# Patient Record
Sex: Male | Born: 1990 | Race: Black or African American | Hispanic: No | Marital: Single | Smoking: Never smoker
Health system: Southern US, Community
[De-identification: ages and names within clinical notes are randomized; demographics above are authoritative.]

---

## 2020-09-12 ENCOUNTER — Telehealth: Payer: Self-pay

## 2020-09-12 NOTE — Telephone Encounter (Signed)
[facility] Patient Product/process development scientist completed.    The patient is insured through [facility] ([place] and has a $120.00 copay. Patient will need a Co-Pay Coupon Card  We will continue to follow to see if copay assistance is needed.  Clearance Coots, CPhT Specialty Pharmacy Patient [facility] Infect[facility] Disease Phone: [phone] Fax:  [phone]

## 2020-09-17 ENCOUNTER — Ambulatory Visit: Payer: BC Managed Care – PPO | Admitting: Infectious Diseases

## 2020-09-17 ENCOUNTER — Other Ambulatory Visit: Payer: Self-pay

## 2020-09-17 ENCOUNTER — Other Ambulatory Visit (HOSPITAL_COMMUNITY)
Admission: RE | Admit: 2020-09-17 | Discharge: 2020-09-17 | Disposition: A | Discharge status: Home / Self Care | Payer: BC Managed Care – PPO | Source: Ambulatory Visit | Attending: Infectious Diseases | Admitting: Infectious Diseases

## 2020-09-17 ENCOUNTER — Encounter: Payer: Self-pay | Admitting: Infectious Diseases

## 2020-09-17 VITALS — BP 136/92 | HR 94 | Temp 98.0°F | Ht 72.0 in | Wt 188.1 lb

## 2020-09-17 DIAGNOSIS — B181 Chronic viral hepatitis B without delta-agent: Secondary | ICD-10-CM | POA: Diagnosis not present

## 2020-09-17 DIAGNOSIS — Z113 Encounter for screening for infections with a predominantly sexual mode of transmission: Secondary | ICD-10-CM | POA: Insufficient documentation

## 2020-09-17 DIAGNOSIS — Z23 Encounter for immunization: Secondary | ICD-10-CM

## 2020-09-17 NOTE — Assessment & Plan Note (Signed)
Urine GC and RPR today 

## 2020-09-17 NOTE — Assessment & Plan Note (Signed)
Has received 2 doses of Astrazeneca vaccine and Flu shot Discussed recommended vaccines with Hep B, will check for serology today

## 2020-09-17 NOTE — Assessment & Plan Note (Signed)
Will do additional labs today for need of Hepatitis B treatment

## 2020-09-17 NOTE — Progress Notes (Signed)
[facility]                                      [address], [place], [place], [address]                                               Phn. [phone]; Fax: [phone]                                                               Date: 09/17/2020 Reason for Visit: Hepatitis B    HPI: Paul Hess is a 29 y.o.old male with no PMH who is concerned for possible Hepatitis B infection. He is originally from [place] and moved to [place] in April 21, 2020 for pursuing higher studies. 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 He says before he came to [place], he tested for Hepatitis B and was told to have Hep B infection and he was given some Multivitamins called " liver care " ( pic in media). He denies taking any other medications for Hepatitis B except the MVs and is here for further evaluation and management. In [place], he was told that his VL is in 100s, Liver function test was WNL.  In terms of risk factors, denies personal or family h/o Liver diseases, sex with partners known to have Hep B. Denies blood transfusion or tattoos. Denies using IVDU. Denies using OTC meds or herbal medicines. Denies sharing of toothbrush or razors  He has received astrazeneca 2 doses in [place] and  flu vaccine in health center  ROS: Denies yellowish discoloration of sclera and skin, abdominal pain/distension, hematemesis.            Denies cough, fever, chills, nightsweats, nausea, vomiting, diarrhea, constipation, weight loss, recent hospitalizations, rashes, joint complaints, shortness of breath, chest pain, headaches, dysuria .  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 No  pets at home    Physical exam: Ht 6' (1.829 m)   Wt 188 lb 1.6 oz (85.3 kg)   BMI 25.51 kg/m   Gen: Alert and oriented x 3, no acute distress HEENT: [place]/AT, PERL,no scleral icterus, no pale conjunctivae, hearing normal, oral mucosa moist Neck: Supple, no lymphadenopathy Cardio: Regular rate and rhythm; +S1 and S2; no murmurs, gallops, or rubs Resp: CTAB; no wheezes, rhonchi, or rales GI: Soft, nontender, nondistended, bowel sounds present GU: Musc: Extremities: No cyanosis, clubbing, or edema; +2 PT and DP pulses Skin: No rashes, lesions, or ecchymoses Neuro: No focal deficits Psych: Calm, cooperative   Laboratory  09/05/20 Cr 1.17, total protein 7.7, albumin 4.9, TB 0.5, DB 0.1, Indirect Bili 0.4, AST 15, ALT 12 Wbc 7.3, HB 14, platelets 232 Hep B surface antigen reactive    Assessment/Plan: 1.  Chronic Hepatitis B Discussed the pathogenesis, transmission, prevention, risks of left untreated, and treatment options for hepatitis C Following labs and Imagings today  Orders Placed This Encounter  Procedures  . US ABDOMEN COMPLETE W/ELASTOGRAPHY  . Hepatitis B core antibody, IgM  . Hepatitis B core antibody, total  . Hepatitis B DNA, ultraquantitative, PCR  . Hepatitis B e antibody  . Hepatitis B e antigen  . Hepatitis B surface antibody,qualitative  . Hepatitis B surface antigen  . HIV antibody (with reflex)  . Hepatitis C antibody  . Hepatitis delta antibody  . Liver Fibrosis, FibroTest-ActiTest  . Hepatitis A antibody, total  . RPR   Fu in 3 weeks for discussion of above results and need for treatment   2. STD screening  RPR and Urine GC today   3. Counseling Avoid Alcohol Cessation Discussed recommended vaccines for hepatitis B, will plan it next visit after reviewing labs today  4. Health Maintenance Has received 2 doses of Astrazeneca vaccine at [place] and Flu vaccine at the [facility]   I spent greater than 45 minutes with the patient including  review of prior medical records with greater than 50% of time in face to face counsel of the patient.    Patient's labs were reviewed as well as his previous records. Patients questions were addressed and answered.   Electronically signed by:  Odette Fraction, MD Infectious Diseases  Office phone [phone] Fax no. [phone]

## 2020-09-18 LAB — URINE CYTOLOGY ANCILLARY ONLY
Chlamydia: NEGATIVE
Comment: NEGATIVE
Comment: NORMAL
Neisseria Gonorrhea: NEGATIVE

## 2020-09-23 ENCOUNTER — Ambulatory Visit (HOSPITAL_COMMUNITY): Payer: BC Managed Care – PPO

## 2020-09-23 LAB — RPR: RPR Ser Ql: NONREACTIVE

## 2020-09-23 LAB — HEPATITIS B CORE ANTIBODY, IGM: Hep B C IgM: NONREACTIVE

## 2020-09-23 LAB — LIVER FIBROSIS, FIBROTEST-ACTITEST
ALT: 16 U/L (ref 9–46)
Alpha-2-Macroglobulin: 123 mg/dL (ref 106–279)
Apolipoprotein A1: 140 mg/dL (ref 94–176)
Bilirubin: 0.7 mg/dL (ref 0.2–1.2)
Fibrosis Score: 0.08
GGT: 26 U/L (ref 3–70)
Haptoglobin: 126 mg/dL (ref 43–212)
Necroinflammat ACT Score: 0.04
Reference ID: 3672780

## 2020-09-23 LAB — HEPATITIS B SURFACE ANTIGEN: Hepatitis B Surface Ag: REACTIVE — AB

## 2020-09-23 LAB — HEPATITIS B CORE ANTIBODY, TOTAL: Hep B Core Total Ab: REACTIVE — AB

## 2020-09-23 LAB — HEPATITIS C ANTIBODY
Hepatitis C Ab: NONREACTIVE
SIGNAL TO CUT-OFF: 0.01 (ref <1.00)

## 2020-09-23 LAB — HEPATITIS B E ANTIGEN: Hep B E Ag: NONREACTIVE

## 2020-09-23 LAB — HEPATITIS B E ANTIBODY: Hep B E Ab: REACTIVE — AB

## 2020-09-23 LAB — HEPATITIS B SURFACE ANTIBODY,QUALITATIVE: Hep B S Ab: NONREACTIVE

## 2020-09-23 LAB — HEPATITIS B DNA, ULTRAQUANTITATIVE, PCR
Hepatitis B DNA (Calc): 3.54 Log IU/mL — ABNORMAL HIGH
Hepatitis B DNA: 3450 IU/mL — ABNORMAL HIGH

## 2020-09-23 LAB — HEPATITIS DELTA ANTIBODY: Hepatitis D Ab, Total: NEGATIVE

## 2020-09-23 LAB — HIV ANTIBODY (ROUTINE TESTING W REFLEX): HIV 1&2 Ab, 4th Generation: NONREACTIVE

## 2020-09-23 LAB — HEPATITIS A ANTIBODY, TOTAL: Hepatitis A AB,Total: NONREACTIVE

## 2020-09-24 ENCOUNTER — Ambulatory Visit (HOSPITAL_COMMUNITY): Payer: BC Managed Care – PPO

## 2020-09-25 ENCOUNTER — Ambulatory Visit (HOSPITAL_COMMUNITY)
Admission: RE | Admit: 2020-09-25 | Discharge: 2020-09-25 | Disposition: A | Discharge status: Home / Self Care | Payer: BC Managed Care – PPO | Source: Ambulatory Visit | Attending: Infectious Diseases | Admitting: Infectious Diseases

## 2020-09-25 ENCOUNTER — Other Ambulatory Visit: Payer: Self-pay

## 2020-09-25 DIAGNOSIS — B181 Chronic viral hepatitis B without delta-agent: Secondary | ICD-10-CM | POA: Insufficient documentation

## 2020-10-08 ENCOUNTER — Ambulatory Visit: Payer: BC Managed Care – PPO | Attending: Family

## 2020-10-08 DIAGNOSIS — Z23 Encounter for immunization: Secondary | ICD-10-CM

## 2020-10-09 ENCOUNTER — Encounter: Payer: Self-pay | Admitting: Infectious Diseases

## 2020-10-09 ENCOUNTER — Ambulatory Visit: Payer: BC Managed Care – PPO | Admitting: Infectious Diseases

## 2020-10-09 ENCOUNTER — Other Ambulatory Visit: Payer: Self-pay

## 2020-10-09 VITALS — BP 168/81 | HR 76 | Temp 98.4°F | Wt 187.0 lb

## 2020-10-09 DIAGNOSIS — Z283 Underimmunization status: Secondary | ICD-10-CM | POA: Diagnosis not present

## 2020-10-09 DIAGNOSIS — B1910 Unspecified viral hepatitis B without hepatic coma: Secondary | ICD-10-CM

## 2020-10-09 DIAGNOSIS — B181 Chronic viral hepatitis B without delta-agent: Secondary | ICD-10-CM | POA: Diagnosis not present

## 2020-10-09 DIAGNOSIS — Z23 Encounter for immunization: Secondary | ICD-10-CM

## 2020-10-09 DIAGNOSIS — Z2839 Other underimmunization status: Secondary | ICD-10-CM

## 2020-10-09 NOTE — Assessment & Plan Note (Signed)
Counseled on methods of prevention of hepatitis B

## 2020-10-09 NOTE — Assessment & Plan Note (Signed)
Hep A vaccine #1 today

## 2020-10-09 NOTE — Assessment & Plan Note (Signed)
Discussed lab results Fu in 3 months for Hep DNA and ALT

## 2020-10-09 NOTE — Assessment & Plan Note (Signed)
Counseled on COVID Booster 

## 2020-10-09 NOTE — Patient Instructions (Signed)
Persons Who Are HBsAg Positive Should:  Have household and sexual contacts vaccinatedUse barrier protection during sexual intercourse if partner is notvaccinated or is not naturally immuneNot share toothbrushes or razorsNot share injection equipmentNot share glucose testing equipmentCover open cuts and scratchesClean blood spills with bleach solutionNot donate blood, organs, or sperm  Children and Adults Who Are HBsAg Positive:  Can participate in all activities, including contact sportsShould not be excluded from daycare or school participation andshould not be isolated from other childrenCan share food and utensils and kiss othersHEPATOLOGY, Vol. 67, No. 4, 2018TERRAULT ET GM.0102

## 2020-10-09 NOTE — Progress Notes (Signed)
[facility]                                                             [address], [place], [place], [address]                                                                  Phn. [phone]; Fax: [phone]                                                                             Date: 10/09/2020  Reason for Follow Up- Discuss lab results and Imagings  Assessment # Chronic Hepatitis B, asymptomatic -Discussed lab results including US abdomen with ealstography results and management plan at length with need for monitoring in the future  # Immunization Need -Hep A vaccine # 1  # Counseling  - On methods of prevention of Hepatitis B  - Avoid hepatotoxins   Pertinent Labs  09/17/20 hep A ab total NR,  Hep B surface ag reactive, hep B surface ab NR, hep B core ab IgM NR. Hep E ab R. Hep E ag NR, Hep D ab total NR, hep B DNA 3, 450, hep B core total ab Reactive HCV ab NR HIV NR Fibrosis score F0  Plan Fu in 9 weeks for repeat labs ( ALT and HBV DNA) Hep A # 1st dose today, nursing visit for # 2 dose Fu in 9 weeks   All questions and concerns were discussed and addressed. Patient verbalized understanding of the plan. ____________________________________________________________________________________________________________________ Subjective/Interval Events 10/09/2020 Paul Hess is a 30 Y O Male with Hepatitis B who is here for discussion of his lab results and imagings for Hepatitis B.  He has been doing well since last clinic visit. No complaints today. He is going to get COVID booster tomorrow. Denies liver disease in the family  Discussed about he does not meet the treatment criteria for Hep B treatment and will need to be monitored in the future.    ROS: Constitutional: Negative for fever, chills, activity change, appetite change, fatigue and unexpected weight change.   HENT: Negative for congestion, sore throat, rhinorrhea, sneezing, trouble swallowing and sinus pressure.  Eyes: Negative for photophobia and visual disturbance.  Respiratory: Negative for cough, chest tightness, shortness of breath, wheezing and stridor.  Cardiovascular: Negative for chest pain, palpitations and leg swelling.  Gastrointestinal: Negative for nausea, vomiting, abdominal pain, diarrhea, constipation, blood in stool, abdominal distention and anal bleeding.  Genitourinary: Negative for dysuria, hematuria, flank pain and difficulty urinating.  Musculoskeletal: Negative for myalgias, back pain, joint swelling, arthralgias and gait problem.  Skin: Negative for color change, pallor, rash and wound.  Neurological: Negative for dizziness, tremors, weakness and light-headedness.  Hematological: Negative for adenopathy. Does not bruise/bleed  easily.  Psychiatric/Behavioral: Negative for behavioral problems, confusion, sleep disturbance, dysphoric mood, decreased concentration and agitation.   Social History   Socioeconomic History  . Marital status: Single    Spouse name: Not on file  . Number of children: Not on file  . Years of education: Not on file  . Highest education level: Not on file  Occupational History  . Not on file  Tobacco Use  . Smoking status: Never Smoker  . Smokeless tobacco: Not on file  Substance and Sexual Activity  . Alcohol use: Not Currently  . Drug use: Not Currently    Types: Marijuana  . Sexual activity: Yes    Partners: Female  Other Topics Concern  . Not on file  Social History Narrative  . Not on file   Social Determinants of Health   Financial Resource Strain: Not on file  Food Insecurity: Not on file  Transportation Needs: Not on file  Physical Activity: Not on file  Stress: Not on file  Social Connections: Not on file  Intimate Partner Violence: Not on file    Vitals BP (!) 168/81   Pulse 76   Temp 98.4 F (36.9 C) (Oral)   Wt  187 lb (84.8 kg)   BMI 25.36 kg/m    Examination  General - not in acute distress, comfortably sitting in chair HEENT - PEERLA, no pallor and no icterus Chest - b/l clear air entry, no additional sounds CVS- Normal s1s2, RRR Abdomen - Soft, Non tender , non distended Ext- no pedal edema Neuro: grossly normal Back - WNL Psych : calm and cooperative   Pertinent Imaging US abdomen w Elastography   FINDINGS: ULTRASOUND ABDOMEN  Gallbladder: Normally distended without stones or wall thickening. No pericholecystic fluid or sonographic Murphy sign.  Common bile duct: Diameter: 3 mm, normal  Liver: Normal echogenicity. Small calcification RIGHT lobe 5 mm diameter. No additional mass or nodularity. Portal vein is patent on color Doppler imaging with normal direction of blood flow towards the liver.  IVC: Normal appearance  Pancreas: Visualized body and proximal tail normal appearance, remainder obscured by bowel gas  Spleen: Normal appearance, 6.9 cm length  Right Kidney: Length: 9.6 cm. Normal morphology without mass or hydronephrosis.  Left Kidney: Length: 9.7 cm. Normal morphology without mass or hydronephrosis.  Abdominal aorta: Normal caliber  Other findings: No free fluid  ULTRASOUND HEPATIC ELASTOGRAPHY  Device: Siemens Helix VTQ  Patient position: Supine  Transducer 5C1  Number of measurements: 10  Hepatic segment:  8  Median kPa: 3.1  IQR: 0.3  IQR/Median kPa ratio: 0.1  Data quality:  Good  Diagnostic category:  < or = 5 kPa: high probability of being normal  The use of hepatic elastography is applicable to patients with viral hepatitis and non-alcoholic fatty liver disease. At this time, there is insufficient data for the referenced cut-off values and use in other causes of liver disease, including alcoholic liver disease. Patients, however, may be assessed by elastography and serve as their own reference  standard/baseline.  In patients with non-alcoholic liver disease, the values suggesting compensated advanced chronic liver disease (cACLD) may be lower, and patients may need additional testing with elasticity results of 7-9 kPa.  Please note that abnormal hepatic elasticity and shear wave velocities may also be identified in clinical settings other than with hepatic fibrosis, such as: acute hepatitis, elevated right heart and central venous pressures including use of beta blockers, veno-occlusive disease (Budd-Chiari), infiltrative processes such as mastocytosis/amyloidosis/infiltrative tumor/lymphoma, extrahepatic  cholestasis, with hyperemia in the post-prandial state, and with liver transplantation. Correlation with patient history, laboratory data, and clinical condition recommended.  Diagnostic Categories:  < or =5 kPa: high probability of being normal  < or =9 kPa: in the absence of other known clinical signs, rules out cACLD  >9 kPa and ?13 kPa: suggestive of cACLD, but needs further testing  >13 kPa: highly suggestive of cACLD  > or =17 kPa: highly suggestive of cACLD with an increased probability of clinically significant portal hypertension  IMPRESSION: ULTRASOUND ABDOMEN:  No upper abdominal sonographic abnormalities.  ULTRASOUND HEPATIC ELASTOGRAPHY:  Median kPa:  3.1  Diagnostic category:  < or = 5 kPa: high probability of being normal  All pertinent labs/Imagings/notes reviewed. All pertinent plain films and CT images have been personally visualized and interpreted; radiology reports have been reviewed. Decision making incorporated into the Impression / Recommendations.  I spent greater than 25 minutes with the patient including  review of prior medical records with greater than 50% of time in face to face counsel of the patient.    Electronically signed by:  Odette Fraction, MD Infectious Disease Physician [facility]  for Infect[facility] Disease [address] [place], [place] [address] Phone: [phone]  Fax: [phone]

## 2020-12-10 ENCOUNTER — Other Ambulatory Visit: Payer: Self-pay

## 2020-12-10 ENCOUNTER — Ambulatory Visit: Payer: BC Managed Care – PPO | Admitting: Infectious Diseases

## 2020-12-10 ENCOUNTER — Encounter: Payer: Self-pay | Admitting: Infectious Diseases

## 2020-12-10 VITALS — BP 158/92 | HR 84 | Temp 98.2°F | Wt 186.0 lb

## 2020-12-10 DIAGNOSIS — B181 Chronic viral hepatitis B without delta-agent: Secondary | ICD-10-CM

## 2020-12-10 NOTE — Progress Notes (Signed)
[facility]                                                             [address], [place], [place], [address]                                                                  Phn. [phone]; Fax: [phone]                                                                             Date: 12/10/20  Reason for Follow Up: hepatitis B infection   Assessment # Chronic Hepatitis B, asymptomatic # Immunization Need -Hep A vaccine # 2 pending   # Counseling  - On methods of prevention of Hepatitis B  - Avoid hepatotoxins   Plan ALT and hep B DNA today Fu in 3 months Hep A vaccine #2 in next clinic visit  All questions and concerns were discussed and addressed. Patient verbalized understanding of the plan. ____________________________________________________________________________________________________________________ Subjective/Interval Events  10/09/2020 Paul Hess is a 30 Y O Male with Hepatitis B who is here for discussion of his lab results and imagings for Hepatitis B.  He has been doing well since last clinic visit. No complaints today. He is going to get COVID booster tomorrow. Denies liver disease in the family  Discussed about he does not meet the treatment criteria for Hep B treatment and will need to be monitored in the future.    12/10/20 Here for follow up for Hepatitis B infection. Doing well. No complaints. Denies N/V/abdominal pain and diarrhea. Appetite is good. Discussed about modes of transmission of Hepatitis B and need to avoid hepatotoxins. Denies h/o Hepatitis B in his mother or  Family. Denies h/o HCC in family.   ROS: Constitutional: Negative for fever, chills, activity change, appetite change, fatigue and unexpected weight change.  HENT: Negative for congestion, sore throat, rhinorrhea, sneezing, trouble swallowing and sinus pressure.  Eyes: Negative for  photophobia and visual disturbance.  Respiratory: Negative for cough, chest tightness, shortness of breath, wheezing and stridor.  Cardiovascular: Negative for chest pain, palpitations and leg swelling.  Gastrointestinal: Negative for nausea, vomiting, abdominal pain, diarrhea, constipation, blood in stool, abdominal distention and anal bleeding.  Genitourinary: Negative for dysuria, hematuria, flank pain and difficulty urinating.  Musculoskeletal: Negative for myalgias, back pain, joint swelling, arthralgias and gait problem.  Skin: Negative for color change, pallor, rash and wound.  Neurological: Negative for dizziness, tremors, weakness and light-headedness.  Hematological: Negative for adenopathy. Does not bruise/bleed easily.  Psychiatric/Behavioral: Negative for behavioral problems, confusion, sleep disturbance, dysphoric mood, decreased concentration and agitation.   Current Outpatient Medications on File Prior to Visit  Medication Sig Dispense Refill  . doxycycline (VIBRA-TABS) 100 MG tablet  No current facility-administered medications on file prior to visit.    Social History   Socioeconomic History  . Marital status: Single    Spouse name: Not on file  . Number of children: Not on file  . Years of education: Not on file  . Highest education level: Not on file  Occupational History  . Not on file  Tobacco Use  . Smoking status: Never Smoker  . Smokeless tobacco: Never Used  Substance and Sexual Activity  . Alcohol use: Not Currently  . Drug use: Not Currently    Types: Marijuana  . Sexual activity: Yes    Partners: Female  Other Topics Concern  . Not on file  Social History Narrative  . Not on file   Social Determinants of Health   Financial Resource Strain: Not on file  Food Insecurity: Not on file  Transportation Needs: Not on file  Physical Activity: Not on file  Stress: Not on file  Social Connections: Not on file  Intimate Partner Violence: Not on  file    Vitals BP (!) 158/92   Pulse 84   Temp 98.2 F (36.8 C) (Oral)   Wt 186 lb (84.4 kg)   BMI 25.23 kg/m    Examination  General - not in acute distress, comfortably sitting in chair HEENT - PEERLA, no pallor and no icterus, no thrush Chest - b/l clear air entry, no additional sounds CVS- Normal s1s2, RRR Abdomen - Soft, Non tender , non distended Ext- no pedal edema Neuro: grossly normal Back - WNL Psych : calm and cooperative  Pertinent Labs  09/17/20 hep A ab total NR,  Hep B surface ag reactive, hep B surface ab NR, hep B core ab IgM NR. Hep E ab R. Hep E ag NR, Hep D ab total NR, hep B DNA 3, 450, hep B core total ab Reactive HCV ab NR HIV NR Fibrosis score F0  Pertinent Microbiology Reviewed   Pertinent Imaging FINDINGS: ULTRASOUND ABDOMEN  Gallbladder: Normally distended without stones or wall thickening. No pericholecystic fluid or sonographic Murphy sign.  Common bile duct: Diameter: 3 mm, normal  Liver: Normal echogenicity. Small calcification RIGHT lobe 5 mm diameter. No additional mass or nodularity. Portal vein is patent on color Doppler imaging with normal direction of blood flow towards the liver.  IVC: Normal appearance  Pancreas: Visualized body and proximal tail normal appearance, remainder obscured by bowel gas  Spleen: Normal appearance, 6.9 cm length  Right Kidney: Length: 9.6 cm. Normal morphology without mass or hydronephrosis.  Left Kidney: Length: 9.7 cm. Normal morphology without mass or hydronephrosis.  Abdominal aorta: Normal caliber  Other findings: No free fluid  ULTRASOUND HEPATIC ELASTOGRAPHY  Device: Siemens Helix VTQ  Patient position: Supine  Transducer 5C1  Number of measurements: 10  Hepatic segment:  8  Median kPa: 3.1  IQR: 0.3  IQR/Median kPa ratio: 0.1  Data quality:  Good  Diagnostic category:  < or = 5 kPa: high probability of being normal  The use of hepatic  elastography is applicable to patients with viral hepatitis and non-alcoholic fatty liver disease. At this time, there is insufficient data for the referenced cut-off values and use in other causes of liver disease, including alcoholic liver disease. Patients, however, may be assessed by elastography and serve as their own reference standard/baseline.  In patients with non-alcoholic liver disease, the values suggesting compensated advanced chronic liver disease (cACLD) may be lower, and patients may need additional testing with elasticity results  of 7-9 kPa.  Please note that abnormal hepatic elasticity and shear wave velocities may also be identified in clinical settings other than with hepatic fibrosis, such as: acute hepatitis, elevated right heart and central venous pressures including use of beta blockers, veno-occlusive disease (Budd-Chiari), infiltrative processes such as mastocytosis/amyloidosis/infiltrative tumor/lymphoma, extrahepatic cholestasis, with hyperemia in the post-prandial state, and with liver transplantation. Correlation with patient history, laboratory data, and clinical condition recommended.  Diagnostic Categories:  < or =5 kPa: high probability of being normal  < or =9 kPa: in the absence of other known clinical signs, rules out cACLD  >9 kPa and ?13 kPa: suggestive of cACLD, but needs further testing  >13 kPa: highly suggestive of cACLD  > or =17 kPa: highly suggestive of cACLD with an increased probability of clinically significant portal hypertension  IMPRESSION: ULTRASOUND ABDOMEN:  No upper abdominal sonographic abnormalities.  ULTRASOUND HEPATIC ELASTOGRAPHY:  Median kPa:  3.1  Diagnostic category:  < or = 5 kPa: high probability of being normal  All pertinent labs/Imagings/notes reviewed. All pertinent plain films and CT images have been personally visualized and interpreted; radiology reports have been reviewed. Decision  making incorporated into the Impression / Recommendations.  I spent 30 minutes with the patient including  review of prior medical records with greater than 50% of time in face to face counsel of the patient.    Electronically signed by:  Odette Fraction, MD Infectious Disease Physician [facility] [address] [place], [place] [address] Phone: [phone]  Fax: [phone]

## 2020-12-11 ENCOUNTER — Ambulatory Visit: Payer: BC Managed Care – PPO | Admitting: Infectious Diseases

## 2020-12-13 LAB — HEPATITIS B DNA, ULTRAQUANTITATIVE, PCR
Hepatitis B DNA (Calc): 2.49 Log IU/mL — ABNORMAL HIGH
Hepatitis B DNA: 310 IU/mL — ABNORMAL HIGH

## 2020-12-13 LAB — ALT: ALT: 13 U/L (ref 9–46)

## 2021-02-02 NOTE — Progress Notes (Signed)
   Covid-19 Vaccination Clinic  Name:  Orland Visconti    MRN: [id number] DOB: [birth year]  02/02/2021  Mr. Burtch was observed post Covid-19 immunization for 15 minutes without incident. He was provided with Vaccine Information Sheet and instruction to access the V-Safe system.   Mr. Hollingshed was instructed to call 911 with any severe reactions post vaccine: Marland Kitchen Difficulty breathing  . Swelling of face and throat  . A fast heartbeat  . A bad rash all over body  . Dizziness and weakness   Immunizations Administered    Name Date Dose VIS Date Route   Moderna Covid-19 Booster Vaccine 10/08/2020 12:30 PM 0.25 mL 07/23/2020 Intramuscular   Manufacturer: Moderna   Lot: [id number]   NDC: 67893-810-17

## 2021-03-10 ENCOUNTER — Ambulatory Visit: Payer: BC Managed Care – PPO | Admitting: Infectious Diseases

## 2021-03-10 ENCOUNTER — Encounter: Payer: Self-pay | Admitting: Infectious Diseases

## 2021-03-10 ENCOUNTER — Other Ambulatory Visit: Payer: Self-pay

## 2021-03-10 VITALS — BP 139/84 | HR 75 | Temp 98.4°F | Ht 73.0 in | Wt 187.0 lb

## 2021-03-10 DIAGNOSIS — Z23 Encounter for immunization: Secondary | ICD-10-CM | POA: Diagnosis not present

## 2021-03-10 DIAGNOSIS — B181 Chronic viral hepatitis B without delta-agent: Secondary | ICD-10-CM | POA: Diagnosis not present

## 2021-03-10 NOTE — Progress Notes (Signed)
[facility]                                                             [address], [place], [place], [address]                                                                  Phn. [phone]; Fax: [phone]                                                                             Date: 12/10/20  Reason for Follow Up: hepatitis B infection   Assessment Problem List Items Addressed This Visit      Digestive   Hepatitis B infection - Primary   Relevant Orders   Hepatitis B DNA, ultraquantitative, PCR   ALT     Other   Immunization due      # Chronic Hepatitis B, asymptomatic  # Immunization Need -Hep A vaccine # 2 pending   # Counseling  - On methods of prevention of Hepatitis B  - Avoid hepatotoxins   Plan ALT and hep B DNA today Fu in 4 months Hep A vaccine #2 in next clinic visit (6 month post 1st dose)  All questions and concerns were discussed and addressed. Patient verbalized understanding of the plan. ____________________________________________________________________________________________________________________ Subjective/Interval Events  10/09/2020 Paul Hess is a 30 Y O Male with Hepatitis B who is here for discussion of his lab results and imagings for Hepatitis B.  He has been doing well since last clinic visit. No complaints today. He is going to get COVID booster tomorrow. Denies liver disease in the family  Discussed about he does not meet the treatment criteria for Hep B treatment and will need to be monitored in the future.    12/10/20 Here for follow up for Hepatitis B infection. Doing well. No complaints. Denies N/V/abdominal pain and diarrhea. Appetite is good. Discussed about modes of transmission of Hepatitis B and need to avoid hepatotoxins. Denies h/o Hepatitis B in his mother or  Family. Denies h/o HCC in family.  For follow-up of  hepatitis B  03/10/21 Here for follow-up for hepatitis B. denies any complaints -denies any fevers chills and sweats.  Denies nausea vomiting diarrhea and abdominal pain.  Denies joint pain, hematemesis altered mental status, recent illness or hospitalization.  He is taking doxycycline for acne in his face and following up with a dermatologist.  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  He is sexually active with a male partner.  discussed with him that his sexual partner needs to be vaccinated for hepatitis B if not already immune.  He will be getting his second dose of hepatitis A vaccine in next visit.  Uses marijuana  intermittently, denies smoking and alcohol.  ROS: Constitutional: Negative for fever, chills, activity change, appetite change, fatigue and unexpected weight change.  HENT: Negative for congestion, sore throat, rhinorrhea, sneezing, trouble swallowing and sinus pressure.  Eyes: Negative for photophobia and visual disturbance.  Respiratory: Negative for cough, chest tightness, shortness of breath, wheezing and stridor.  Cardiovascular: Negative for chest pain, palpitations and leg swelling.  Gastrointestinal: Negative for nausea, vomiting, abdominal pain, diarrhea, constipation, blood in stool, abdominal distention and anal bleeding.  Genitourinary: Negative for dysuria, hematuria, flank pain and difficulty urinating.  Musculoskeletal: Negative for myalgias, back pain, joint swelling, arthralgias and gait problem.  Skin: Negative for color change, pallor, rash and wound.  Neurological: Negative for dizziness, tremors, weakness and light-headedness.  Hematological: Negative for adenopathy. Does not bruise/bleed easily.  Psychiatric/Behavioral: Negative for behavioral problems, confusion, sleep disturbance, dysphoric mood, decreased concentration and agitation.   Current Outpatient Medications on File Prior to Visit  Medication Sig Dispense Refill  .  doxycycline (VIBRA-TABS) 100 MG tablet      No current facility-administered medications on file prior to visit.    Social History   Socioeconomic History  . Marital status: Single    Spouse name: Not on file  . Number of children: Not on file  . Years of education: Not on file  . Highest education level: Not on file  Occupational History  . Not on file  Tobacco Use  . Smoking status: Never Smoker  . Smokeless tobacco: Never Used  Substance and Sexual Activity  . Alcohol use: Not Currently  . Drug use: Not Currently    Types: Marijuana  . Sexual activity: Yes    Partners: Female  Other Topics Concern  . Not on file  Social History Narrative  . Not on file   Social Determinants of Health   Financial Resource Strain: Not on file  Food Insecurity: Not on file  Transportation Needs: Not on file  Physical Activity: Not on file  Stress: Not on file  Social Connections: Not on file  Intimate Partner Violence: Not on file    Vitals BP 139/84   Pulse 75   Temp 98.4 F (36.9 C) (Oral)   Ht 6\' 1"  (1.854 m)   Wt 187 lb (84.8 kg)   SpO2 100%   BMI 24.67 kg/m '  Examination  General - not in acute distress, comfortably sitting in chair HEENT - PEERLA, no pallor and no icterus, no thrush Chest - b/l clear air entry, no additional sounds CVS- Normal s1s2, RRR Abdomen - Soft, Non tender , non distended Ext- no pedal edema Neuro: grossly normal Back - WNL Psych : calm and cooperative  Pertinent Labs  09/17/20 hep A ab total NR,  Hep B surface ag reactive, hep B surface ab NR, hep B core ab IgM NR. Hep E ab R. Hep E ag NR, Hep D ab total NR, hep B DNA 3, 450, hep B core total ab Reactive HCV ab NR HIV NR Fibrosis score F0  Pertinent Microbiology Reviewed   Pertinent Imaging FINDINGS: ULTRASOUND ABDOMEN  Gallbladder: Normally distended without stones or wall thickening. No pericholecystic fluid or sonographic Murphy sign.  Common bile duct: Diameter: 3 mm,  normal  Liver: Normal echogenicity. Small calcification RIGHT lobe 5 mm diameter. No additional mass or nodularity. Portal vein is patent on color Doppler imaging with normal direction of blood flow towards the liver.  IVC: Normal appearance  Pancreas: Visualized body and proximal tail normal appearance, remainder  obscured by bowel gas  Spleen: Normal appearance, 6.9 cm length  Right Kidney: Length: 9.6 cm. Normal morphology without mass or hydronephrosis.  Left Kidney: Length: 9.7 cm. Normal morphology without mass or hydronephrosis.  Abdominal aorta: Normal caliber  Other findings: No free fluid  ULTRASOUND HEPATIC ELASTOGRAPHY  Device: Siemens Helix VTQ  Patient position: Supine  Transducer 5C1  Number of measurements: 10  Hepatic segment:  8  Median kPa: 3.1  IQR: 0.3  IQR/Median kPa ratio: 0.1  Data quality:  Good  Diagnostic category:  < or = 5 kPa: high probability of being normal  The use of hepatic elastography is applicable to patients with viral hepatitis and non-alcoholic fatty liver disease. At this time, there is insufficient data for the referenced cut-off values and use in other causes of liver disease, including alcoholic liver disease. Patients, however, may be assessed by elastography and serve as their own reference standard/baseline.  In patients with non-alcoholic liver disease, the values suggesting compensated advanced chronic liver disease (cACLD) may be lower, and patients may need additional testing with elasticity results of 7-9 kPa.  Please note that abnormal hepatic elasticity and shear wave velocities may also be identified in clinical settings other than with hepatic fibrosis, such as: acute hepatitis, elevated right heart and central venous pressures including use of beta blockers, veno-occlusive disease (Budd-Chiari), infiltrative processes such as mastocytosis/amyloidosis/infiltrative  tumor/lymphoma, extrahepatic cholestasis, with hyperemia in the post-prandial state, and with liver transplantation. Correlation with patient history, laboratory data, and clinical condition recommended.  Diagnostic Categories:  < or =5 kPa: high probability of being normal  < or =9 kPa: in the absence of other known clinical signs, rules out cACLD  >9 kPa and ?13 kPa: suggestive of cACLD, but needs further testing  >13 kPa: highly suggestive of cACLD  > or =17 kPa: highly suggestive of cACLD with an increased probability of clinically significant portal hypertension  IMPRESSION: ULTRASOUND ABDOMEN:  No upper abdominal sonographic abnormalities.  ULTRASOUND HEPATIC ELASTOGRAPHY:  Median kPa:  3.1  Diagnostic category:  < or = 5 kPa: high probability of being normal  All pertinent labs/Imagings/notes reviewed. All pertinent plain films and CT images have been personally visualized and interpreted; radiology reports have been reviewed. Decision making incorporated into the Impression / Recommendations.  I spent 30 minutes with the patient including  review of prior medical records with greater than 50% of time in face to face counsel of the patient.    Electronically signed by:  Odette Fraction, MD Infectious Disease Physician [facility] [address] [place], [place] [address] Phone: [phone]  Fax: [phone]

## 2021-03-12 LAB — HEPATITIS B DNA, ULTRAQUANTITATIVE, PCR
Hepatitis B DNA (Calc): 2.92 Log IU/mL — ABNORMAL HIGH
Hepatitis B DNA: 836 IU/mL — ABNORMAL HIGH

## 2021-03-12 LAB — ALT: ALT: 12 U/L (ref 9–46)

## 2021-06-15 ENCOUNTER — Other Ambulatory Visit (HOSPITAL_COMMUNITY): Payer: Self-pay

## 2021-06-15 ENCOUNTER — Encounter: Payer: Self-pay | Admitting: Infectious Diseases

## 2021-06-15 ENCOUNTER — Telehealth: Payer: Self-pay

## 2021-06-15 ENCOUNTER — Other Ambulatory Visit: Payer: Self-pay

## 2021-06-15 ENCOUNTER — Ambulatory Visit (INDEPENDENT_AMBULATORY_CARE_PROVIDER_SITE_OTHER): Payer: BC Managed Care – PPO | Admitting: Infectious Diseases

## 2021-06-15 VITALS — BP 147/85 | HR 77 | Temp 98.4°F | Ht 73.0 in | Wt 184.0 lb

## 2021-06-15 DIAGNOSIS — Z23 Encounter for immunization: Secondary | ICD-10-CM | POA: Diagnosis not present

## 2021-06-15 DIAGNOSIS — B181 Chronic viral hepatitis B without delta-agent: Secondary | ICD-10-CM | POA: Diagnosis not present

## 2021-06-15 NOTE — Progress Notes (Signed)
[facility]                                                             [address], [place], [place], [address]                                                                  Phn. [phone]; Fax: [phone]                                                                             Date: 06/15/21  Reason for Follow Up: hepatitis B infection   Assessment Problem List Items Addressed This Visit       Digestive   Hepatitis B infection - Primary   Relevant Orders   Hepatitis B DNA, ultraquantitative, PCR   ALT   Hepatitis A vaccine adult IM (Completed)     Other   Need for hepatitis A vaccination   Relevant Orders   Hepatitis A vaccine adult IM (Completed)    # Chronic Hepatitis B, asymptomatic  # Immunization Need -Hep A vaccine # 2  today  # Counseling  - On methods of prevention of Hepatitis B  - Avoid hepatotoxins    Plan ALT and hep B DNA today Fu in 4-5 months Hep A vaccine #2 today  All questions and concerns were discussed and addressed. Patient verbalized understanding of the plan. ____________________________________________________________________________________________________________________ Subjective/Interval Events  10/09/2020 Paul Hess is a 30 Y O Male with Hepatitis B who is here for follow up of hepatitis B. Doing well with no specific complaints. 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 Denies any nausea, vomiting, abdominal pain/distension. Denies any GI bleed, diarrhea or joint pains. Discussed about getting 2nd dose  of Hepatitis A vaccine , getting labs today and fu in 5 months.   ROS: Negative for fever, chills, activity change, appetite change, fatigue and unexpected weight change.  Negative for joint pain/artharlgia Negative for new medications or  hospital admission  Current Outpatient Medications on File Prior to Visit  Medication Sig Dispense Refill   clindamycin (CLEOCIN T) 1 % external solution Apply topically.     No current facility-administered medications on file prior to visit.    Social History   Socioeconomic History   Marital status: Single    Spouse name: Not on file   Number of children: Not on file   Years of education: Not on file   Highest education level: Not on file  Occupational History   Not on file  Tobacco Use   Smoking status: Never   Smokeless tobacco: Never  Substance and Sexual Activity   Alcohol use: Not Currently   Drug  use: Not Currently    Types: Marijuana   Sexual activity: Not Currently    Partners: Female  Other Topics Concern   Not on file  Social History Narrative   Not on file   Social Determinants of Health   Financial Resource Strain: Not on file  Food Insecurity: Not on file  Transportation Needs: Not on file  Physical Activity: Not on file  Stress: Not on file  Social Connections: Not on file  Intimate Partner Violence: Not on file    Vitals BP (!) 147/85   Pulse 77   Temp 98.4 F (36.9 C) (Oral)   Ht 6\' 1"  (1.854 m)   Wt 184 lb (83.5 kg)   SpO2 100%   BMI 24.28 kg/m    Examination  General - not in acute distress, comfortably sitting in chair HEENT - PEERLA, no pallor and no icterus, no thrush Chest - b/l clear air entry, no additional sounds CVS- Normal s1s2, RRR Abdomen - Soft, Non tender , non distended Ext- no pedal edema Neuro: grossly normal Back - WNL Psych : calm and cooperative  Pertinent Labs  09/17/20 hep A ab total NR,  Hep B surface ag reactive, hep B surface ab NR, hep B core ab IgM NR. Hep E ab R. Hep E ag NR, Hep D ab total NR, hep B DNA 3, 450, hep B core total ab Reactive HCV ab NR HIV NR Fibrosis score F0  CMP Latest Ref Rng & Units 03/10/2021 12/10/2020 09/17/2020  ALT 9 - 46 U/L 12 13 16     Pertinent Microbiology Reviewed    Pertinent Imaging FINDINGS: ULTRASOUND ABDOMEN   Gallbladder: Normally distended without stones or wall thickening. No pericholecystic fluid or sonographic Murphy sign.   Common bile duct: Diameter: 3 mm, normal   Liver: Normal echogenicity. Small calcification RIGHT lobe 5 mm diameter. No additional mass or nodularity. Portal vein is patent on color Doppler imaging with normal direction of blood flow towards the liver.   IVC: Normal appearance   Pancreas: Visualized body and proximal tail normal appearance, remainder obscured by bowel gas   Spleen: Normal appearance, 6.9 cm length   Right Kidney: Length: 9.6 cm. Normal morphology without mass or hydronephrosis.   Left Kidney: Length: 9.7 cm. Normal morphology without mass or hydronephrosis.   Abdominal aorta: Normal caliber   Other findings: No free fluid   ULTRASOUND HEPATIC ELASTOGRAPHY   Device: Siemens Helix VTQ   Patient position: Supine   Transducer 5C1   Number of measurements: 10   Hepatic segment:  8   Median kPa: 3.1   IQR: 0.3   IQR/Median kPa ratio: 0.1   Data quality:  Good   Diagnostic category:  < or = 5 kPa: high probability of being normal   The use of hepatic elastography is applicable to patients with viral hepatitis and non-alcoholic fatty liver disease. At this time, there is insufficient data for the referenced cut-off values and use in other causes of liver disease, including alcoholic liver disease. Patients, however, may be assessed by elastography and serve as their own reference standard/baseline.   In patients with non-alcoholic liver disease, the values suggesting compensated advanced chronic liver disease (cACLD) may be lower, and patients may need additional testing with elasticity results of 7-9 kPa.   Please note that abnormal hepatic elasticity and shear wave velocities may also be identified in clinical settings other than with hepatic fibrosis, such as: acute  hepatitis, elevated right heart and central  venous pressures including use of beta blockers, veno-occlusive disease (Budd-Chiari), infiltrative processes such as mastocytosis/amyloidosis/infiltrative tumor/lymphoma, extrahepatic cholestasis, with hyperemia in the post-prandial state, and with liver transplantation. Correlation with patient history, laboratory data, and clinical condition recommended.   Diagnostic Categories:   < or =5 kPa: high probability of being normal   < or =9 kPa: in the absence of other known clinical signs, rules out cACLD   >9 kPa and ?13 kPa: suggestive of cACLD, but needs further testing   >13 kPa: highly suggestive of cACLD   > or =17 kPa: highly suggestive of cACLD with an increased probability of clinically significant portal hypertension   IMPRESSION: ULTRASOUND ABDOMEN:   No upper abdominal sonographic abnormalities.   ULTRASOUND HEPATIC ELASTOGRAPHY:   Median kPa:  3.1   Diagnostic category:  < or = 5 kPa: high probability of being normal   All pertinent labs/Imagings/notes reviewed. All pertinent plain films and CT images have been personally visualized and interpreted; radiology reports have been reviewed. Decision making incorporated into the Impression / Recommendations.  I spent 30 minutes with the patient including  review of prior medical records with greater than 50% of time in face to face counsel of the patient.    Electronically signed by:  Odette Fraction, MD Infectious Disease Physician [facility] [address] [place], [place] [address] Phone: [phone]  Fax: [phone]

## 2021-06-15 NOTE — Telephone Encounter (Signed)
[facility] Patient Advocate Encounter  Insurance verification completed.    The patient is insured through [employer].  Medication will need a PA.  We will continue to follow to see if copay assistance is needed.  Mairyn Lenahan, CPhT Specialty Pharmacy Patient Advocate [facility] Phone: [phone] Fax:  [phone]  

## 2021-06-17 LAB — HEPATITIS B DNA, ULTRAQUANTITATIVE, PCR
Hepatitis B DNA (Calc): 2.58 Log IU/mL — ABNORMAL HIGH
Hepatitis B DNA: 382 IU/mL — ABNORMAL HIGH

## 2021-06-17 LAB — ALT: ALT: 14 U/L (ref 9–46)

## 2021-11-13 ENCOUNTER — Other Ambulatory Visit: Payer: Self-pay

## 2021-11-13 ENCOUNTER — Ambulatory Visit: Payer: BC Managed Care – PPO | Admitting: Infectious Diseases

## 2021-11-13 VITALS — BP 130/84 | HR 67 | Temp 98.1°F | Ht 73.0 in | Wt 195.0 lb

## 2021-11-13 DIAGNOSIS — B181 Chronic viral hepatitis B without delta-agent: Secondary | ICD-10-CM

## 2021-11-13 DIAGNOSIS — Z709 Sex counseling, unspecified: Secondary | ICD-10-CM | POA: Diagnosis not present

## 2021-11-13 NOTE — Progress Notes (Signed)
[facility]                                                             [address], [place], [place], [address]                                                                  Phn. [phone]; Fax: [phone]                                                                             Date: 11/13/21  Reason for Follow Up: hepatitis B infection   Assessment Problem List Items Addressed This Visit       Digestive   Hepatitis B infection - Primary   Relevant Orders   ALT   Hepatitis B DNA, ultraquantitative, PCR   Hepatitis B surface antigen (Completed)     Other   Sex counseling   # Chronic Hepatitis B, asymptomatic  Plan ALT, HBV DBA and hepatitis B surface ag today Safe sex counseling done.  Fu in 5-6 months   All questions and concerns were discussed and addressed. Patient verbalized understanding of the plan. ____________________________________________________________________________________________________________________ Subjective/Interval Events  11/13/21 Paul Hess is a 31 Y O Male with Hepatitis B who is here for regular follow up.  Doing well with no specific complaints. 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 He is considering different job options from [employer] and  [place]. Discussed with him to follow up with a provider for Hep B in case he moves out from [facility]. Has quit smoking and alcohol. Denies IVDU. Recently broke up with his girlfriend and no other sexual partners apart from her.   ROS: Negative for fever, chills, activity change, appetite change, fatigue and unexpected weight change.  Negative for joint pain/artharlgia Negative for urgent care visits/ hospital admission  Current Outpatient Medications on File Prior to Visit  Medication Sig Dispense Refill   clindamycin (CLEOCIN T) 1 %  external solution Apply topically.     No current facility-administered medications on file prior to visit.    Social History   Socioeconomic History   Marital status: Single    Spouse name: Not on file   Number of children: Not on file   Years of education: Not on file   Highest education level: Not on file  Occupational History   Not on file  Tobacco Use   Smoking status: Never   Smokeless tobacco: Never  Substance and Sexual Activity   Alcohol use: Not Currently   Drug use: Not Currently    Types: Marijuana   Sexual activity: Not Currently    Partners: Female  Other Topics Concern   Not  on file  Social History Narrative   Not on file   Social Determinants of Health   Financial Resource Strain: Not on file  Food Insecurity: Not on file  Transportation Needs: Not on file  Physical Activity: Not on file  Stress: Not on file  Social Connections: Not on file  Intimate Partner Violence: Not on file    Vitals BP 130/84    Pulse 67    Temp 98.1 F (36.7 C) (Oral)    Ht 6\' 1"  (1.854 m)    Wt 195 lb (88.5 kg)    SpO2 100%    BMI 25.73 kg/m    Examination  General - not in acute distress, comfortably sitting in chair HEENT - PEERLA, no pallor and no icterus, no thrush Chest - b/l clear air entry, no additional sounds CVS- Normal s1s2, RRR Abdomen - Soft, Non tender , non distended Ext- no pedal edema Neuro: grossly normal Psych : calm and cooperative  Pertinent Labs  09/17/20 hep A ab total NR,  Hep B surface ag reactive, hep B surface ab NR, hep B core ab IgM NR. Hep E ab R. Hep E ag NR, Hep D ab total NR, hep B DNA 3, 450, hep B core total ab Reactive HCV ab NR HIV NR Fibrosis score F0  CMP Latest Ref Rng & Units 06/15/2021 03/10/2021 12/10/2020  ALT 9 - 46 U/L 14 12 13     Pertinent Microbiology Reviewed   Pertinent Imaging All pertinent labs/Imagings/notes reviewed. All pertinent plain films and CT images have been personally visualized and interpreted;  radiology reports have been reviewed. Decision making incorporated into the Impression / Recommendations.  I spent 35 minutes with the patient including  review of prior medical records with greater than 50% of time in face to face counsel of the patient.    Electronically signed by:  02/09/2021, MD Infectious Disease Physician [facility] [address] [place], [facility] [place] Phone: [phone]   Fax: [phone]

## 2021-11-16 LAB — ALT: ALT: 19 U/L (ref 9–46)

## 2021-11-16 LAB — HEPATITIS B DNA, ULTRAQUANTITATIVE, PCR
Hepatitis B DNA (Calc): 2.58 Log IU/mL — ABNORMAL HIGH
Hepatitis B DNA: 376 IU/mL — ABNORMAL HIGH

## 2021-11-16 LAB — HEPATITIS B SURFACE ANTIGEN: Hepatitis B Surface Ag: REACTIVE — AB

## 2021-11-17 ENCOUNTER — Telehealth: Payer: Self-pay

## 2021-11-17 NOTE — Telephone Encounter (Signed)
-----   Message from Rosiland Oz, MD sent at 11/17/2021  6:54 AM EST ----- Let him know, labs do not indicate need for Hep treatment and will follow up in few months.

## 2021-11-17 NOTE — Telephone Encounter (Signed)
Relayed results to patient. Does not have any questions at this time. Juanita Laster, RMA

## 2022-04-16 ENCOUNTER — Ambulatory Visit: Payer: BC Managed Care – PPO | Admitting: Infectious Diseases

## 2022-04-30 ENCOUNTER — Ambulatory Visit: Payer: BC Managed Care – PPO | Admitting: Infectious Diseases

## 2022-05-11 IMAGING — US US ABDOMEN COMPLETE W/ ELASTOGRAPHY
2 series · 12 of 25 positions shown · non-contrast
Comparison: None

CLINICAL DATA: Chronic viral hepatitis-B without delta agent and
without hepatic coma

EXAM:
ULTRASOUND ABDOMEN
ULTRASOUND HEPATIC ELASTOGRAPHY
TECHNIQUE: Sonography of the upper abdomen was performed. In addition,
ultrasound elastography evaluation of the liver was performed. A
region of interest was placed within the right lobe of the liver.
Following application of a compressive sonographic pulse, tissue
compressibility was assessed. Multiple assessments were performed at
the selected site. Median tissue compressibility was determined.
Previously, hepatic stiffness was assessed by shear wave velocity.
Based on recently published [employer] in [employer]
consensus article, reporting is now recommended to be performed in
the SI units of pressure (kiloPascals) representing hepatic
stiffness/elasticity. The obtained result is compared to the
published reference standards. (cACLD = compensated Advanced Chronic
Liver Disease)

[Series 1: us abdomen complete w/elastography · 10 of 87 slices shown]
[im 5/87]
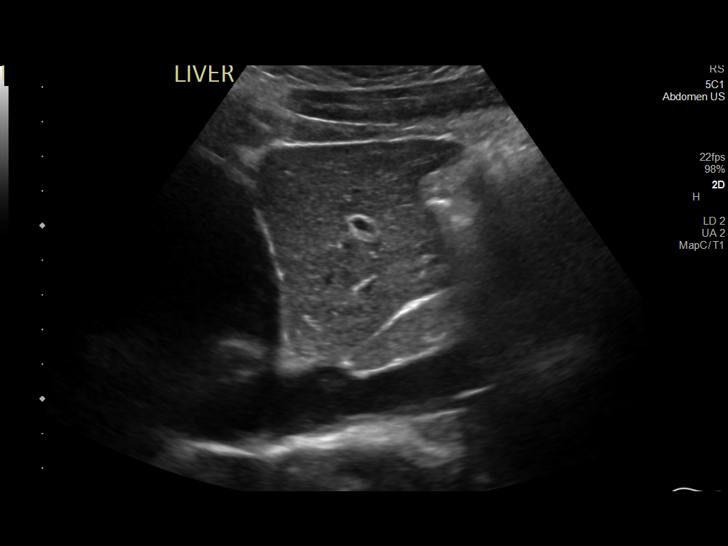
[im 13/87]
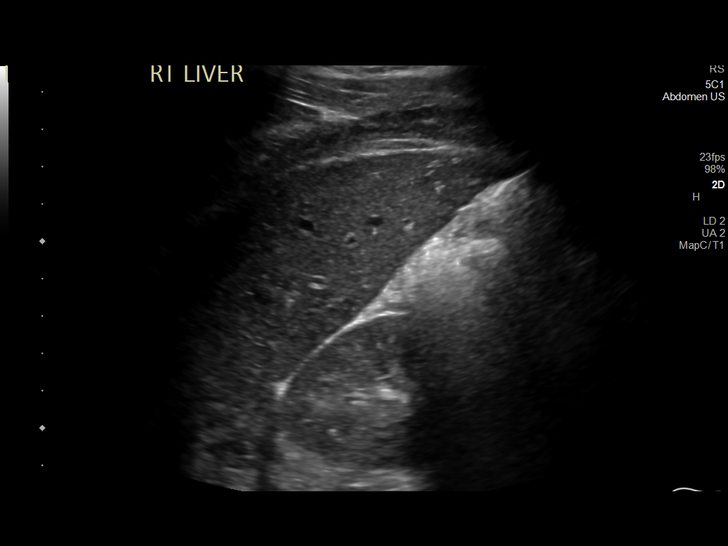
[im 22/87]
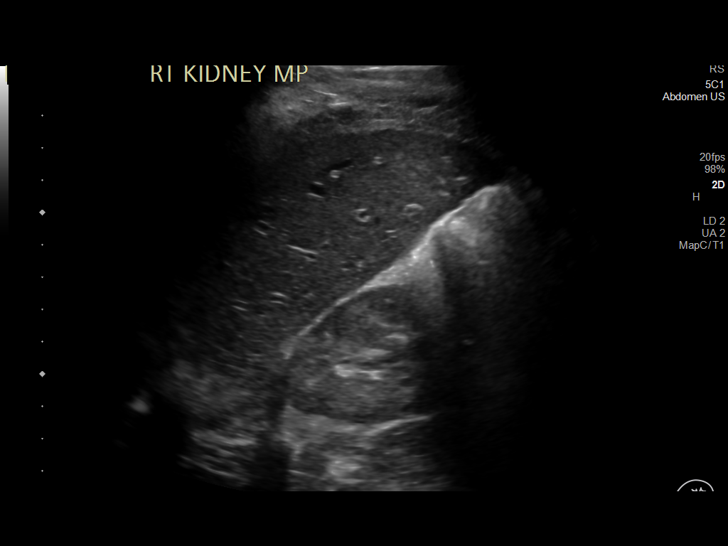
[im 31/87]
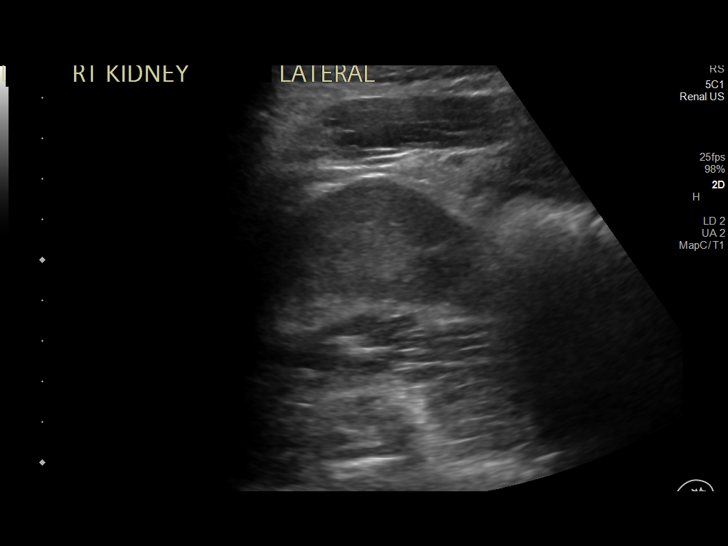
[im 39/87]
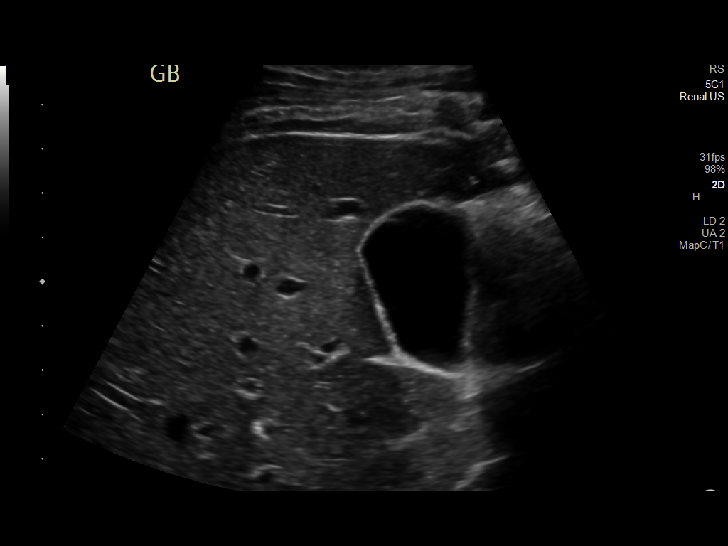
[im 48/87]
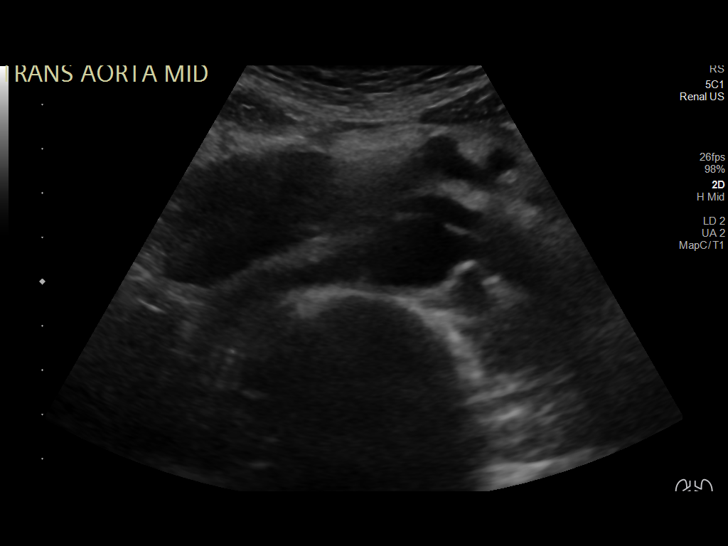
[im 56/87]
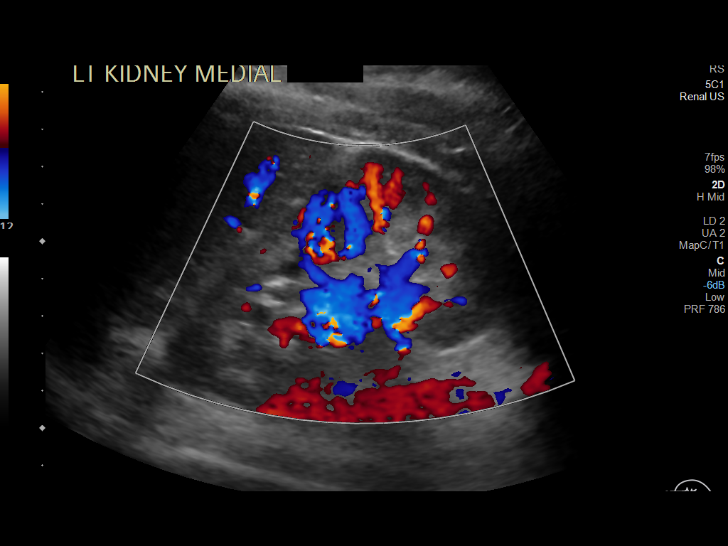
[im 65/87]
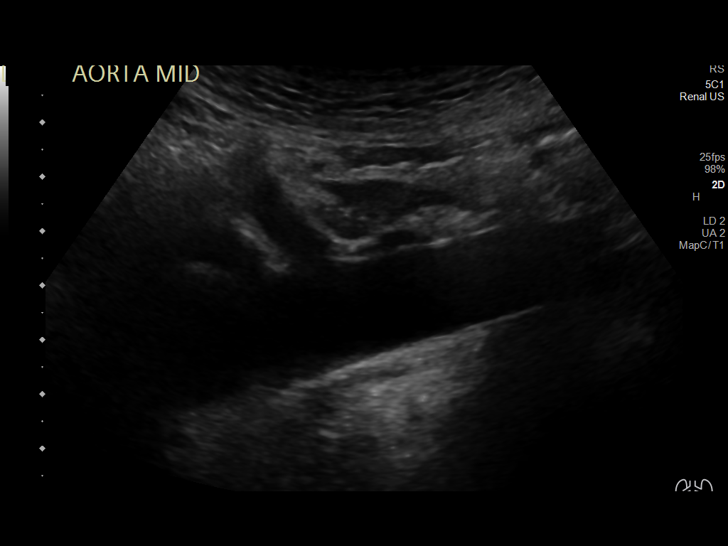
[im 74/87]
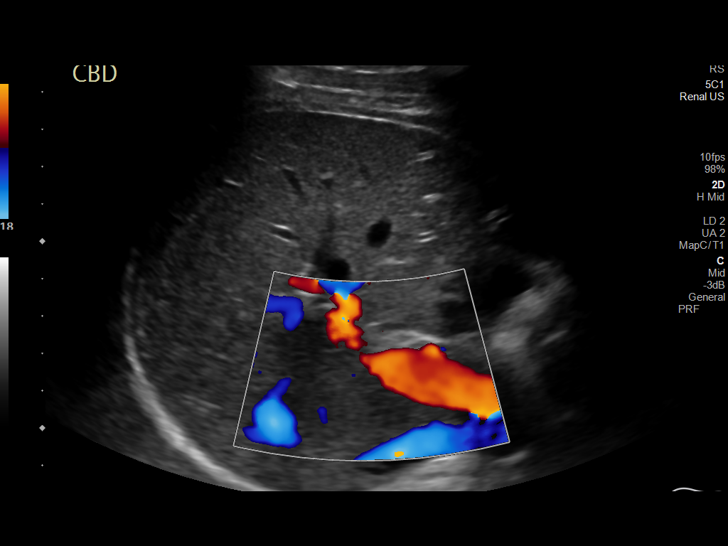
[im 82/87]
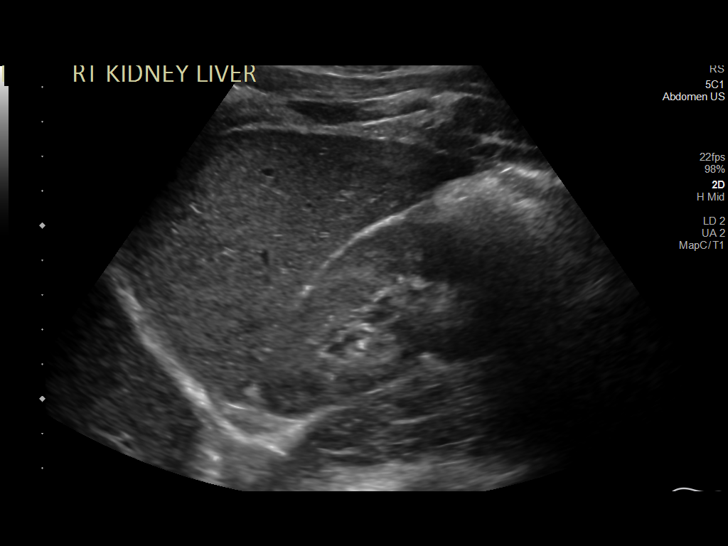

[Series 1001: abdomen us · 2 of 17 slices shown]
[im 1/17]
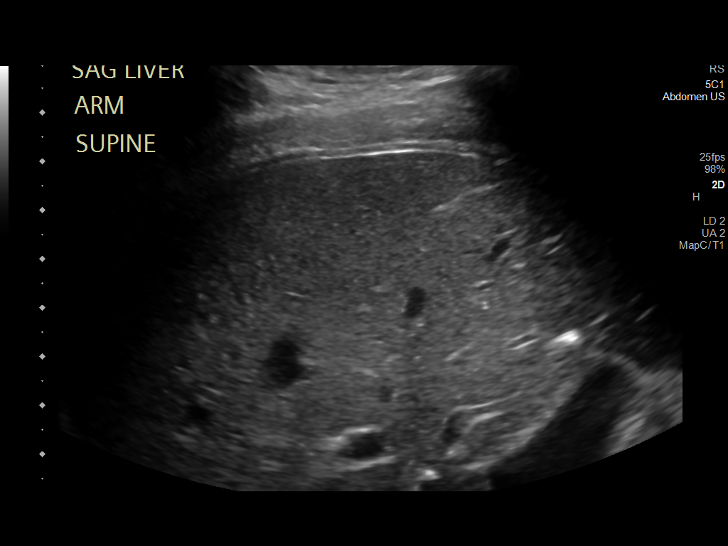
[im 11/17]
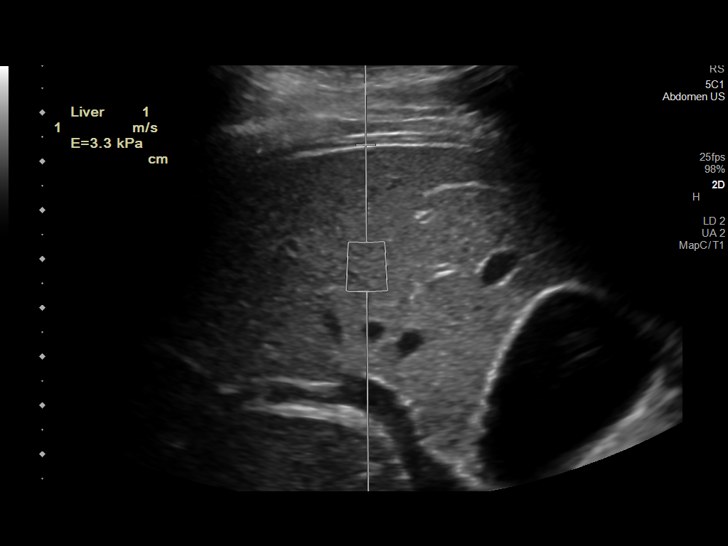

[12 of 25 positions shown; findings below may reference images not displayed]

FINDINGS: ULTRASOUND ABDOMEN

Gallbladder: Normally distended without stones or wall thickening.
No pericholecystic fluid or sonographic Murphy sign.

Common bile duct: Diameter: 3 mm, normal

Liver: Normal echogenicity. Small calcification RIGHT lobe 5 mm
diameter. No additional mass or nodularity. Portal vein is patent on
color Doppler imaging with normal direction of blood flow towards
the liver.

IVC: Normal appearance

Pancreas: Visualized body and proximal tail normal appearance,
remainder obscured by bowel gas

Spleen: Normal appearance, 6.9 cm length

Right Kidney: Length: 9.6 cm. Normal morphology without mass or
hydronephrosis.

Left Kidney: Length: 9.7 cm. Normal morphology without mass or
hydronephrosis.

Abdominal aorta: Normal caliber

Other findings: No free fluid

ULTRASOUND HEPATIC ELASTOGRAPHY

Device: Siemens Helix VTQ

Patient position: Supine

Transducer [id number]

Number of measurements: 10

Hepatic segment:  8

Median kPa:

IQR:

IQR/Median kPa ratio:

Data quality:  Good

Diagnostic category:  < or = 5 kPa: high probability of being normal

The use of hepatic elastography is applicable to patients with viral
hepatitis and non-alcoholic fatty liver disease. At this time, there
is insufficient data for the referenced cut-off values and use in
other causes of liver disease, including alcoholic liver disease.
Patients, however, may be assessed by elastography and serve as
their own reference standard/baseline.

In patients with non-alcoholic liver disease, the values suggesting
compensated advanced chronic liver disease (cACLD) may be lower, and
patients may need additional testing with elasticity results of [DATE]
kPa.

Please note that abnormal hepatic elasticity and shear wave
velocities may also be identified in clinical settings other than
with hepatic fibrosis, such as: acute hepatitis, elevated right
heart and central venous pressures including use of beta blockers,
Ludwig disease (Hermann-Josef), infiltrative processes such as
mastocytosis/amyloidosis/infiltrative tumor/lymphoma, extrahepatic
cholestasis, with hyperemia in the post-prandial state, and with
liver transplantation. Correlation with patient history, laboratory
data, and clinical condition recommended.

Diagnostic Categories:

< or =5 kPa: high probability of being normal

< or =9 kPa: in the absence of other known clinical signs, rules [DATE] kPa and ?13 kPa: suggestive of cACLD, but needs further testing

>13 kPa: highly suggestive of cACLD

> or =17 kPa: highly suggestive of cACLD with an increased
probability of clinically significant portal hypertension
IMPRESSION: ULTRASOUND ABDOMEN:

No upper abdominal sonographic abnormalities.

ULTRASOUND HEPATIC ELASTOGRAPHY:

Median kPa:

Diagnostic category:  < or = 5 kPa: high probability of being normal
